# Patient Record
Sex: Female | Born: 1990 | Race: White | Hispanic: No | Marital: Single | Smoking: Never smoker
Health system: Southern US, Community
[De-identification: ages and names within clinical notes are randomized; demographics above are authoritative.]

## PROBLEM LIST (undated history)

## (undated) DIAGNOSIS — N200 Calculus of kidney: Secondary | ICD-10-CM

## (undated) DIAGNOSIS — B084 Enteroviral vesicular stomatitis with exanthem: Secondary | ICD-10-CM

## (undated) HISTORY — PX: BREAST SURGERY: SHX581

---

## 2018-03-10 ENCOUNTER — Encounter (HOSPITAL_COMMUNITY): Payer: Self-pay

## 2018-03-10 ENCOUNTER — Emergency Department (HOSPITAL_COMMUNITY)
Admission: EM | Admit: 2018-03-10 | Discharge: 2018-03-10 | Disposition: A | Discharge status: Home / Self Care | Payer: BLUE CROSS/BLUE SHIELD | Attending: Emergency Medicine | Admitting: Emergency Medicine

## 2018-03-10 DIAGNOSIS — R350 Frequency of micturition: Secondary | ICD-10-CM | POA: Insufficient documentation

## 2018-03-10 DIAGNOSIS — N3001 Acute cystitis with hematuria: Secondary | ICD-10-CM | POA: Diagnosis not present

## 2018-03-10 DIAGNOSIS — R300 Dysuria: Secondary | ICD-10-CM | POA: Diagnosis not present

## 2018-03-10 DIAGNOSIS — R1030 Lower abdominal pain, unspecified: Secondary | ICD-10-CM | POA: Diagnosis present

## 2018-03-10 HISTORY — DX: Enteroviral vesicular stomatitis with exanthem: B08.4

## 2018-03-10 HISTORY — DX: Calculus of kidney: N20.0

## 2018-03-10 LAB — URINALYSIS, ROUTINE W REFLEX MICROSCOPIC
BILIRUBIN URINE: NEGATIVE
GLUCOSE, UA: NEGATIVE mg/dL
Ketones, ur: 5 mg/dL — AB
Nitrite: POSITIVE — AB
Protein, ur: 100 mg/dL — AB
RBC / HPF: >50 RBC/hpf — ABNORMAL HIGH (ref 0–5)
SPECIFIC GRAVITY, URINE: 1.029 (ref 1.005–1.030)
pH: 6 (ref 5.0–8.0)

## 2018-03-10 LAB — CBC
HEMATOCRIT: 39 % (ref 36.0–46.0)
Hemoglobin: 12.7 g/dL (ref 12.0–15.0)
MCH: 29.5 pg (ref 26.0–34.0)
MCHC: 32.6 g/dL (ref 30.0–36.0)
MCV: 90.5 fL (ref 80.0–100.0)
Platelets: 336 10*3/uL (ref 150–400)
RBC: 4.31 MIL/uL (ref 3.87–5.11)
RDW: 12.1 % (ref 11.5–15.5)
WBC: 10.6 10*3/uL — ABNORMAL HIGH (ref 4.0–10.5)
nRBC: 0 % (ref 0.0–0.2)

## 2018-03-10 LAB — COMPREHENSIVE METABOLIC PANEL
ALT: 20 U/L (ref 0–44)
AST: 27 U/L (ref 15–41)
Albumin: 4.1 g/dL (ref 3.5–5.0)
Alkaline Phosphatase: 47 U/L (ref 38–126)
Anion gap: 8 (ref 5–15)
BUN: 19 mg/dL (ref 6–20)
CHLORIDE: 106 mmol/L (ref 98–111)
CO2: 25 mmol/L (ref 22–32)
CREATININE: 0.95 mg/dL (ref 0.44–1.00)
Calcium: 9.6 mg/dL (ref 8.9–10.3)
GFR calc Af Amer: >60 mL/min (ref >60)
GLUCOSE: 92 mg/dL (ref 70–99)
Potassium: 4.3 mmol/L (ref 3.5–5.1)
SODIUM: 139 mmol/L (ref 135–145)
Total Bilirubin: 0.6 mg/dL (ref 0.3–1.2)
Total Protein: 6.3 g/dL — ABNORMAL LOW (ref 6.5–8.1)

## 2018-03-10 LAB — LIPASE, BLOOD: LIPASE: 47 U/L (ref 11–51)

## 2018-03-10 LAB — I-STAT BETA HCG BLOOD, ED (MC, WL, AP ONLY)

## 2018-03-10 MED ORDER — PHENAZOPYRIDINE HCL 200 MG PO TABS: 200.0000 mg | ORAL_TABLET | Freq: Three times a day (TID) | ORAL | 0 refills | Status: DC | PRN

## 2018-03-10 MED ORDER — CEPHALEXIN 500 MG PO CAPS: 500.0000 mg | ORAL_CAPSULE | Freq: Four times a day (QID) | ORAL | 0 refills | Status: DC

## 2018-03-10 MED ORDER — CEPHALEXIN 250 MG PO CAPS
500.0000 mg | ORAL_CAPSULE | Freq: Once | ORAL | Status: AC
  Administered 2018-03-10: 500 mg via ORAL
  Filled 2018-03-10: qty 2

## 2018-03-10 NOTE — ED Notes (Signed)
Patient verbalizes understanding of discharge instructions. Opportunity for questioning and answers were provided. Armband removed by staff, pt discharged from ED ambulatory w/ SO 

## 2018-03-10 NOTE — ED Triage Notes (Signed)
Pt reports that she began to have dysuria this AM, took AZO without relief, pain became worse, no having lower abd pain and hematuria.

## 2018-03-10 NOTE — Discharge Instructions (Addendum)
Keflex and Pyridium as prescribed.  Follow-up with primary doctor if symptoms are not improving in the next 2 to 3 days, and return to the ER if you develop worsening pain, high fevers, or other new and concerning symptoms.

## 2018-03-10 NOTE — ED Provider Notes (Signed)
[facility] EMERGENCY DEPARTMENT Provider Note   CSN: [id number] Arrival date & time: 03/10/18  0053     History   Chief Complaint Chief Complaint  Patient presents with  . Abdominal Pain    HPI Stacy Medina is a 27 y.o. female.  Patient is a 27 year old female with complaints of urinary frequency, dysuria, and suprapubic discomfort that has been present for the past 2 days.  She attempted over-the-counter Azo with little relief.  She denies any fevers or chills.  She denies any bowel complaints.  Her last menstrual period was 3 weeks ago and normal and she denies the possibility of pregnancy.  There is no vaginal discharge or abnormal bleeding.  The history is provided by the patient.  Dysuria   This is a new problem. The current episode started 2 days ago. The problem occurs every urination. The problem has been gradually worsening. The quality of the pain is described as burning. The pain is moderate. There has been no fever. Associated symptoms include hematuria and urgency. Pertinent negatives include no chills. Treatments tried: azo.    Past Medical History:  Diagnosis Date  . Hand, foot and mouth disease   . Kidney stones     There are no active problems to display for this patient.   Past Surgical History:  Procedure Laterality Date  . BREAST SURGERY       OB History   None      Home Medications    Prior to Admission medications   Not on File    Family History No family history on file.  Social History Social History   Tobacco Use  . Smoking status: Never Smoker  . Smokeless tobacco: Never Used  Substance Use Topics  . Alcohol use: Never    Frequency: Never  . Drug use: Never     Allergies   Bactrim [sulfamethoxazole-trimethoprim]   Review of Systems Review of Systems  Constitutional: Negative for chills.  Genitourinary: Positive for dysuria, hematuria and urgency.  All other systems reviewed and are  negative.    Physical Exam Updated Vital Signs BP 123/77 (BP Location: Right Arm)   Pulse (!) 58   Temp (!) 97.4 F (36.3 C) (Oral)   Resp 16   LMP 02/14/2018   SpO2 100%   Physical Exam  Constitutional: She is oriented to person, place, and time. She appears well-developed and well-nourished. No distress.  HENT:  Head: Normocephalic and atraumatic.  Neck: Normal range of motion. Neck supple.  Cardiovascular: Normal rate and regular rhythm. Exam reveals no gallop and no friction rub.  No murmur heard. Pulmonary/Chest: Effort normal and breath sounds normal. No respiratory distress. She has no wheezes.  Abdominal: Soft. Bowel sounds are normal. She exhibits no distension. There is tenderness in the suprapubic area. There is no rigidity, no rebound and no guarding.  Musculoskeletal: Normal range of motion.  Neurological: She is alert and oriented to person, place, and time.  Skin: Skin is warm and dry. She is not diaphoretic.  Nursing note and vitals reviewed.    ED Treatments / Results  Labs (all labs ordered are listed, but only abnormal results are displayed) Labs Reviewed  COMPREHENSIVE METABOLIC PANEL - Abnormal; Notable for the following components:      Result Value   Total Protein 6.3 (*)    All other components within normal limits  CBC - Abnormal; Notable for the following components:   WBC 10.6 (*)    All other  components within normal limits  URINALYSIS, ROUTINE W REFLEX MICROSCOPIC - Abnormal; Notable for the following components:   Color, Urine AMBER (*)    APPearance CLOUDY (*)    Hgb urine dipstick LARGE (*)    Ketones, ur 5 (*)    Protein, ur 100 (*)    Nitrite POSITIVE (*)    Leukocytes, UA SMALL (*)    RBC / HPF >50 (*)    Bacteria, UA RARE (*)    Non Squamous Epithelial 0-5 (*)    All other components within normal limits  LIPASE, BLOOD  I-STAT BETA HCG BLOOD, ED ([facility], [facility], [facility] ONLY)    EKG None  Radiology No results  found.  Procedures Procedures (including critical care time)  Medications Ordered in ED Medications  cephALEXin (KEFLEX) capsule 500 mg (has no administration in time range)     Initial Impression / Assessment and Plan / ED Course  I have reviewed the triage vital signs and the nursing notes.  Pertinent labs & imaging results that were available during my care of the patient were reviewed by me and considered in my medical decision making (see chart for details).  Symptoms, exam, and urinalysis most consistent with a UTI.  This will be treated with Keflex and Pyridium.  She is to return as needed for any problems.  Final Clinical Impressions(s) / ED Diagnoses   Final diagnoses:  None    ED Discharge Orders    None       Geoffery Lyons, MD 03/10/18 [phone]

## 2019-04-06 ENCOUNTER — Encounter: Payer: BLUE CROSS/BLUE SHIELD | Admitting: Certified Nurse Midwife

## 2019-04-28 ENCOUNTER — Ambulatory Visit: Payer: BC Managed Care – PPO | Admitting: Pediatrics

## 2019-04-28 ENCOUNTER — Other Ambulatory Visit: Payer: Self-pay

## 2019-04-28 VITALS — BP 120/72 | Ht 66.0 in | Wt 130.0 lb

## 2019-04-28 DIAGNOSIS — S76112A Strain of left quadriceps muscle, fascia and tendon, initial encounter: Secondary | ICD-10-CM | POA: Diagnosis not present

## 2019-04-28 NOTE — Progress Notes (Signed)
  Stacy Medina - 28 y.o. female MRN [id number]  Date of birth: April 21, 1991  SUBJECTIVE:   CC:  Left quad pain  28 yo female presenting with left quad pain for the past week. She reports that she woke up with pain in mid quad, was limping slightly due to pain. She does not recall an injury or inciting event but she does do HIT workouts often that involves jump squats, etc. She runs 4-5 days a week, 3-9 miles at a time. Earlier in the month, she had a hamstring injury on her right and she wonders if she was overcompensating on her left leg while running. She has tried running on the leg and the pain improves during her runs but does not go away. Pain is 5-6 out of 10. Pain feels like a knot and also very tight. Pain with standing on single leg and she finds herself limping.  ROS: No swelling, instability, numbness/tingling, redness, otherwise see HPI   PMHx - Updated and reviewed.  Contributory factors include: Negative PSHx - Updated and reviewed.  Contributory factors include:  Negative FHx - Updated and reviewed.  Contributory factors include:  Negative Social Hx - Updated and reviewed. Contributory factors include: Negative Medications - reviewed   DATA REVIEWED: none  PHYSICAL EXAM:  VS: BP:120/72  HR: bpm  TEMP: ( )  RESP:   HT:5\' 6"  (167.6 cm)   WT:130 lb (59 kg)  BMI:20.99 PHYSICAL EXAM: Gen: NAD, alert, cooperative with exam, well-appearing HEENT: clear conjunctiva,  CV:  no edema, capillary refill brisk, normal rate Resp: non-labored Skin: no rashes, normal turgor  Neuro: no gross deficits.  Psych:  alert and oriented  Left leg: Inspection: normal appearing leg, strong and defined quads Palpation: TTP at mid vastus lateralis with palpable knot with tenderness ROM: Able to fully flex and extend knee without pain Strength: normal NVI Special testing: Fulcrum test negative No pain with hip IR, ER. FADIR, FABER negative.   Right leg: Inspection: normal appearing leg,  strong and defined quads Palpation: no TTP ROM: full ROM Strength: normal NVI  [place] of left vastus lateralis, vastus medialis, rectus femoris with normal appearing muscle fibers, no hypoechoic changes or disorganization noted. No neovascularization noted.  Impression: normal appearing quadriceps muscles  ASSESSMENT & PLAN:   28 yo female presenting with acute left leg pain with palpable myofascial trigger point. Will treat for quadricep strain although [place] was normal in appearance. Recommended body helix sleeve and rest from activity that causes pain. Return in 3 weeks.  I was the preceptor for this visit and available for immediate consultation Shellia Cleverly, DO

## 2019-05-09 ENCOUNTER — Ambulatory Visit
Admission: RE | Admit: 2019-05-09 | Discharge: 2019-05-09 | Disposition: A | Discharge status: Home / Self Care | Payer: BC Managed Care – PPO | Source: Ambulatory Visit | Attending: Family Medicine | Admitting: Family Medicine

## 2019-05-09 ENCOUNTER — Other Ambulatory Visit: Payer: Self-pay

## 2019-05-09 ENCOUNTER — Ambulatory Visit: Payer: BC Managed Care – PPO | Admitting: Family Medicine

## 2019-05-09 VITALS — BP 104/70 | Ht 66.0 in | Wt 130.0 lb

## 2019-05-09 DIAGNOSIS — M79606 Pain in leg, unspecified: Secondary | ICD-10-CM

## 2019-05-09 DIAGNOSIS — S76112A Strain of left quadriceps muscle, fascia and tendon, initial encounter: Secondary | ICD-10-CM

## 2019-05-09 NOTE — Patient Instructions (Signed)
Get the x-rays of your femur after you leave here. If these are normal I would go ahead with an MRI of your femur to assess for a stress fracture. Icing, tylenol, aleve if needed. Avoid jumping, running, HIIT training for now - cycling, swimming, walking, elliptical are ok if not painful for now.

## 2019-05-10 ENCOUNTER — Encounter: Payer: Self-pay | Admitting: Family Medicine

## 2019-05-10 NOTE — Progress Notes (Signed)
PCP: Pieter Partridge, PA  Subjective:   HPI: Patient is a 28 y.o. female here for left thigh pain.  Patient called today noting worsening of pain since last visit in left quad/thigh. She had some issues with right hamstring prior to this and thinks it's possible she's been putting more weight onto left side with exercise. She was running about 20-40 miles a week prior to start of pain and also doing HIIT workouts. She notes pain seems to improve with running, worse first thing in the morning and has had difficulty bearing weight at times but moreso 2-3 weeks ago. No history of stress fracture. Pain with standing on single leg. No bruising, new injuries.  Past Medical History:  Diagnosis Date  . Hand, foot and mouth disease   . Kidney stones     No current outpatient medications on file prior to visit.   No current facility-administered medications on file prior to visit.    Past Surgical History:  Procedure Laterality Date  . BREAST SURGERY      Allergies  Allergen Reactions  . Bactrim [Sulfamethoxazole-Trimethoprim] Rash    Social History   Socioeconomic History  . Marital status: Single    Spouse name: Not on file  . Number of children: Not on file  . Years of education: Not on file  . Highest education level: Not on file  Occupational History  . Not on file  Tobacco Use  . Smoking status: Never Smoker  . Smokeless tobacco: Never Used  Substance and Sexual Activity  . Alcohol use: Never  . Drug use: Never  . Sexual activity: Not on file  Other Topics Concern  . Not on file  Social History Narrative  . Not on file   Social Determinants of Health   Financial Resource Strain:   . Difficulty of Paying Living Expenses: Not on file  Food Insecurity:   . Worried About Charity fundraiser in the Last Year: Not on file  . Ran Out of Food in the Last Year: Not on file  Transportation Needs:   . Lack of Transportation (Medical): Not on file  . Lack of  Transportation (Non-Medical): Not on file  Physical Activity:   . Days of Exercise per Week: Not on file  . Minutes of Exercise per Session: Not on file  Stress:   . Feeling of Stress : Not on file  Social Connections:   . Frequency of Communication with Friends and Family: Not on file  . Frequency of Social Gatherings with Friends and Family: Not on file  . Attends Religious Services: Not on file  . Active Member of Clubs or Organizations: Not on file  . Attends Archivist Meetings: Not on file  . Marital Status: Not on file  Intimate Partner Violence:   . Fear of Current or Ex-Partner: Not on file  . Emotionally Abused: Not on file  . Physically Abused: Not on file  . Sexually Abused: Not on file    History reviewed. No pertinent family history.  BP 104/70   Ht 5\' 6"  (1.676 m)   Wt 130 lb (59 kg)   LMP 05/06/2019 (Within Days)   BMI 20.98 kg/m   Review of Systems: See HPI above.     Objective:  Physical Exam:  Gen: NAD, comfortable in exam room  Left hip/thigh: No deformity. FROM with 5/5 strength all motions and feels some discomfort with hip external rotation. No tenderness to palpation (reports 'deeper' to palpation  of proximal thigh anteriorly over femur). NVI distally. Negative logroll. Negative fulcrum. Discomfort with hop test.   Assessment & Plan:  1. Left thigh pain - Patient's volume of running, location and depth of pain, severity, discomfort with hop test, normal ultrasound of musculature concerning for possible proximal femur stress fracture despite feeling some improvement with running.  Independently reviewed radiographs and no fracture noted but on femur radiographs there is increased mineralization proximally medial femur - however with how advanced this callus would be coupled with only 3 weeks of symptoms I suspect this is remote and unrelated to her current pain.  Advised against running, jumping, HIIT for now.  Will obtain MRI to further  assess.  Tylenol, aleve if needed.

## 2019-05-10 NOTE — Addendum Note (Signed)
Addended by: Cyd Silence on: 05/10/2019 10:19 AM   Modules accepted: Orders

## 2019-05-19 ENCOUNTER — Ambulatory Visit: Payer: BC Managed Care – PPO | Admitting: Pediatrics

## 2019-05-21 ENCOUNTER — Ambulatory Visit
Admission: RE | Admit: 2019-05-21 | Discharge: 2019-05-21 | Disposition: A | Discharge status: Home / Self Care | Payer: BC Managed Care – PPO | Source: Ambulatory Visit | Attending: Family Medicine | Admitting: Family Medicine

## 2019-05-21 ENCOUNTER — Other Ambulatory Visit: Payer: Self-pay

## 2019-05-21 DIAGNOSIS — M79606 Pain in leg, unspecified: Secondary | ICD-10-CM

## 2019-05-23 ENCOUNTER — Telehealth (INDEPENDENT_AMBULATORY_CARE_PROVIDER_SITE_OTHER): Payer: BC Managed Care – PPO | Admitting: Family Medicine

## 2019-05-23 VITALS — Ht 65.0 in | Wt 135.0 lb

## 2019-05-23 DIAGNOSIS — M79606 Pain in leg, unspecified: Secondary | ICD-10-CM

## 2019-05-23 NOTE — Progress Notes (Signed)
MRI reviewed and discussed with patient via video visit.  She has a couple focal areas of stress reaction of her femoral shaft.  She's not run in over 2 weeks.  Has no pain with cross training, elliptical.  She does have some mild pain briefly when she wakes up in the morning at about a 1-2/10 level at most.  We discussed treatment for this.  Given it's not a true stress fracture and her symptoms are much better than would expect, do not think a 12 week protocol is necessary at this point.  Advised to wait about 2 weeks (sooner if she has no pain in the morning) before starting walk:jog 1:1 for 10 minutes (increase jog interval by 1 minute, total jog time by 5 minutes) every other day.  Ok for weight training at that time as well.  No box jumps, plyo activities.  Plan to f/u in office in 3 weeks for reevaluation.

## 2019-07-01 ENCOUNTER — Ambulatory Visit: Payer: BC Managed Care – PPO

## 2019-07-01 ENCOUNTER — Ambulatory Visit: Payer: BC Managed Care – PPO | Attending: Internal Medicine

## 2019-07-01 DIAGNOSIS — Z23 Encounter for immunization: Secondary | ICD-10-CM | POA: Insufficient documentation

## 2019-07-01 NOTE — Progress Notes (Signed)
   Covid-19 Vaccination Clinic  Name:  Stacy Medina    MRN: [id number] DOB: 05-08-1991  07/01/2019  Ms. Stacy Medina was observed post Covid-19 immunization for 15 minutes without incidence. She was provided with Vaccine Information Sheet and instruction to access the V-Safe system.   Ms. Stacy Medina was instructed to call 911 with any severe reactions post vaccine: Marland Kitchen Difficulty breathing  . Swelling of your face and throat  . A fast heartbeat  . A bad rash all over your body  . Dizziness and weakness    Immunizations Administered    Name Date Dose VIS Date Route   Pfizer COVID-19 Vaccine 07/01/2019  1:16 PM 0.3 mL 04/22/2019 Intramuscular   Manufacturer: [employer], [employer]   Lot: [id number]   NDC: [address]-0

## 2019-07-25 ENCOUNTER — Encounter: Payer: Self-pay | Admitting: Certified Nurse Midwife

## 2019-07-26 ENCOUNTER — Ambulatory Visit: Payer: BC Managed Care – PPO

## 2019-07-26 ENCOUNTER — Ambulatory Visit: Payer: BC Managed Care – PPO | Attending: Internal Medicine

## 2019-07-26 DIAGNOSIS — Z23 Encounter for immunization: Secondary | ICD-10-CM

## 2019-07-26 NOTE — Progress Notes (Signed)
   Covid-19 Vaccination Clinic  Name:  Tinleigh Whitmire    MRN: [id number] DOB: [birth year]  07/26/2019  Ms. Kutter was observed post Covid-19 immunization for 15 minutes without incident. She was provided with Vaccine Information Sheet and instruction to access the V-Safe system.   Ms. Minervini was instructed to call 911 with any severe reactions post vaccine: Marland Kitchen Difficulty breathing  . Swelling of face and throat  . A fast heartbeat  . A bad rash all over body  . Dizziness and weakness   Immunizations Administered    Name Date Dose VIS Date Route   Pfizer COVID-19 Vaccine 07/26/2019  2:51 PM 0.3 mL 04/22/2019 Intramuscular   Manufacturer: [employer], [employer]   Lot: [id number]   NDC: [address]-4

## 2019-07-27 ENCOUNTER — Encounter: Payer: Self-pay | Admitting: Certified Nurse Midwife

## 2019-11-02 ENCOUNTER — Ambulatory Visit (INDEPENDENT_AMBULATORY_CARE_PROVIDER_SITE_OTHER): Payer: BC Managed Care – PPO | Admitting: Sports Medicine

## 2019-11-02 ENCOUNTER — Encounter: Payer: Self-pay | Admitting: Sports Medicine

## 2019-11-02 ENCOUNTER — Other Ambulatory Visit: Payer: Self-pay

## 2019-11-02 VITALS — BP 120/72 | Ht 65.0 in | Wt 130.0 lb

## 2019-11-02 DIAGNOSIS — M7632 Iliotibial band syndrome, left leg: Secondary | ICD-10-CM | POA: Diagnosis not present

## 2019-11-02 NOTE — Patient Instructions (Signed)
The area of your pain is consistent with IT band syndrome.  Given this we will treat you for IT band syndrome to see if this improves your symptoms. -Work on the stretching and strengthening exercises shown to you at today's visit -You may take Aleve twice a day for the next 1 to 2 weeks to see if this improves some of your pain -I would recommend cutting back on the volume of running by about 30 to 40%.  You may cross train by biking, swimming, or elliptical.  I would like to see you back in 1 month if you are still having symptoms and we can modify the plan accordingly

## 2019-11-02 NOTE — Progress Notes (Addendum)
PCP: Roderick Pee, PA  Subjective:   HPI: Patient is a 29 y.o. female here for evaluation of left IT band pain.  Pain started approximately 1 month ago.  Patient ran a half marathon around the time where her pain started hurting.  The pain is located on the lateral aspect of her leg by her knee.  The pain will occasionally radiate up her leg towards her hip.  Occasionally she will get lateral hip pain.  She denies any specific injury or trauma.  The pain is not worsened by running downhill.  The pain is mostly present during the first 3 to 5 minutes of the run and then will improve when she gets warmed up.  Patient does have a history of femoral neck stress reaction back in January that was seen on an MRI.  This improved with rest.  Patient notes the pain she is having now is very different than what she was experiencing when she had the femoral shaft stress reaction.  She has been taking ibuprofen intermittently as needed for pain which is mildly improved her symptoms.   Review of Systems: See HPI above.  Past Medical History:  Diagnosis Date  . Hand, foot and mouth disease   . Kidney stones     No current outpatient medications on file prior to visit.   No current facility-administered medications on file prior to visit.    Past Surgical History:  Procedure Laterality Date  . BREAST SURGERY      Allergies  Allergen Reactions  . Bactrim [Sulfamethoxazole-Trimethoprim] Rash    Social History   Socioeconomic History  . Marital status: Single    Spouse name: Not on file  . Number of children: Not on file  . Years of education: Not on file  . Highest education level: Not on file  Occupational History  . Not on file  Tobacco Use  . Smoking status: Never Smoker  . Smokeless tobacco: Never Used  Substance and Sexual Activity  . Alcohol use: Never  . Drug use: Never  . Sexual activity: Not on file  Other Topics Concern  . Not on file  Social History Narrative  . Not on  file   Social Determinants of Health   Financial Resource Strain:   . Difficulty of Paying Living Expenses:   Food Insecurity:   . Worried About Programme researcher, broadcasting/film/video in the Last Year:   . Barista in the Last Year:   Transportation Needs:   . Freight forwarder (Medical):   Marland Kitchen Lack of Transportation (Non-Medical):   Physical Activity:   . Days of Exercise per Week:   . Minutes of Exercise per Session:   Stress:   . Feeling of Stress :   Social Connections:   . Frequency of Communication with Friends and Family:   . Frequency of Social Gatherings with Friends and Family:   . Attends Religious Services:   . Active Member of Clubs or Organizations:   . Attends Banker Meetings:   Marland Kitchen Marital Status:   Intimate Partner Violence:   . Fear of Current or Ex-Partner:   . Emotionally Abused:   Marland Kitchen Physically Abused:   . Sexually Abused:     History reviewed. No pertinent family history.      Objective:  Physical Exam: BP 120/72   Ht 5\' 5"  (1.651 m)   Wt 130 lb (59 kg)   BMI 21.63 kg/m  Gen: NAD, comfortable in exam  room Lungs: Breathing comfortably on room air Knee Exam Left -Inspection: no deformity, no discoloration -Palpation: No tenderness palpation at the IT band.  No tenderness palpation at the hamstrings. -ROM: Extension: -10 degrees; Flexion: 150 degrees -Strength: Extension: 5/5; Flexion: 5/5 -Special Tests: Varus Stress: Negative; Valgus Stress: Negative; Obers: Negative -Limb neurovascularly intact, no instability noted  Hip Exam Left -Inspection: No deformity, no discoloration -Palpation: No tenderness palpation -ROM: Normal ROM with flexion, extension, abduction, internal rotation, external rotation -Strength: Flexion: 5/5; Extension 5/5; Abduction: 5/5 -Special Tests: Obers: Negative -Limb neurovascularly intact  Limited diagnostic ultrasound of the left IT band Findings: -Small amount of fluid was seen at the IT band insertion.   The IT band itself was intact without any evidence of tearing or fraying.  There were no hypoechoic changes within the IT band itself Impression: -Ultrasound showing fluid collection IT band insertion consistent with IT band syndrome     Assessment & Plan:  Patient is a 29 y.o. female here for left IT band pain  1.  Left IT band syndrome -Location of patient's pain is consistent with IT band syndrome however her physical exam was normal also unable to reproduce her symptoms -Ultrasound of the IT band showed that the IT band itself was intact without hypoechoic changes.  There was a small amount of fluid collection at the IT band insertion. -We will start treatment by treating her for IT band syndrome.  This will include hip abduction strengthening, IT band stretches, taking Aleve twice a day for the next 1 to 2 weeks. -I have also advised that patient cut back her running volume by 3040%.  She may cross train by biking, swimming, or elliptical  Patient will follow up in 4 weeks if she is not having improvement in her pain  I was the preceptor for this visit and available for immediate consultation Shellia Cleverly, DO

## 2021-02-21 IMAGING — MR MR FEMUR*L* W/O CM
4 of 5 series · 11 of 40 positions shown · non-contrast
Comparison: Plain films left hip and femur 05/09/2019.

CLINICAL DATA: Proximal left thigh pain for 1 month which began
after beginning a high intensity workout program. Pain has begun to
improve over the past few weeks.

EXAM:
MR OF THE LEFT FEMUR WITHOUT CONTRAST
TECHNIQUE: Multiplanar, multisequence MR imaging of the left femur. Was
performed. No intravenous contrast was administered.

[Series 5: T1 · coronal · 4.0mm · 0.49mm/px · 3 of 28 slices shown (1 of 2)]
[im 6/28]
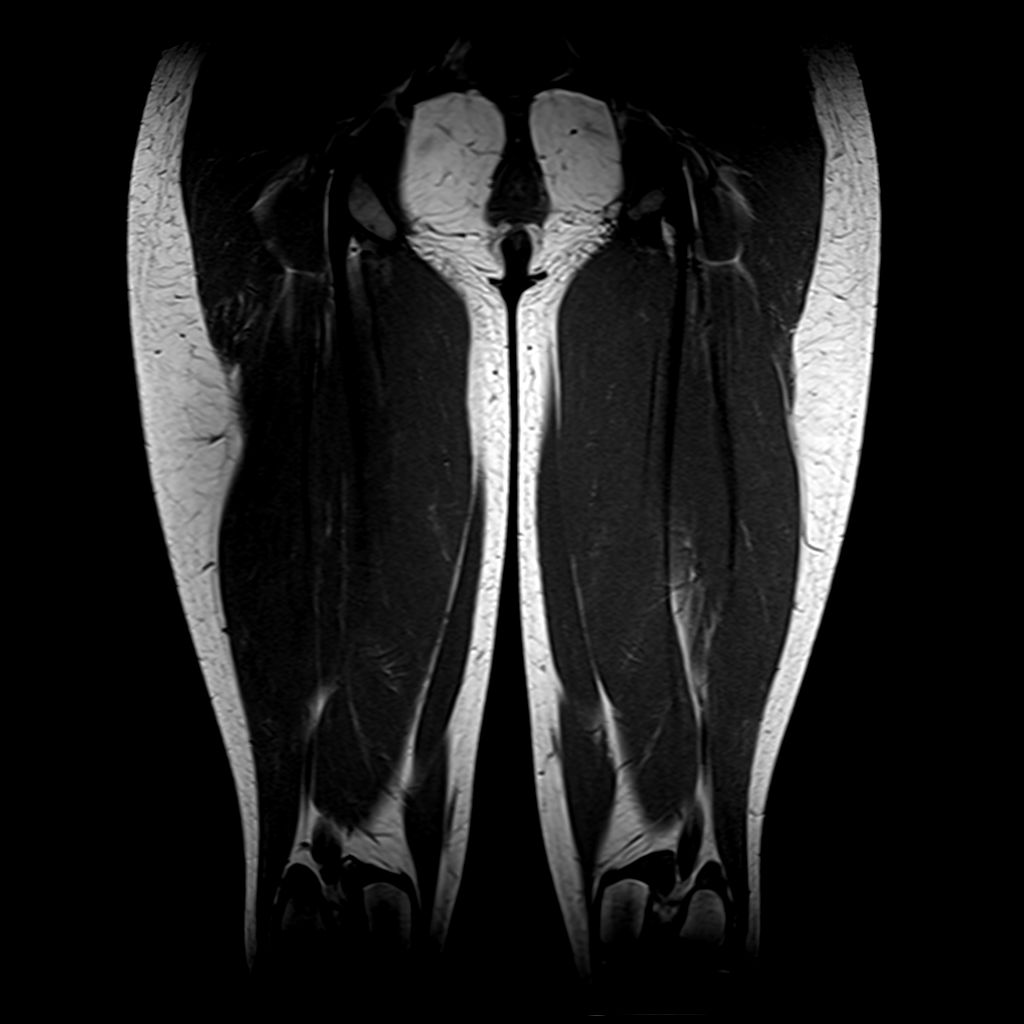
[im 17/28]
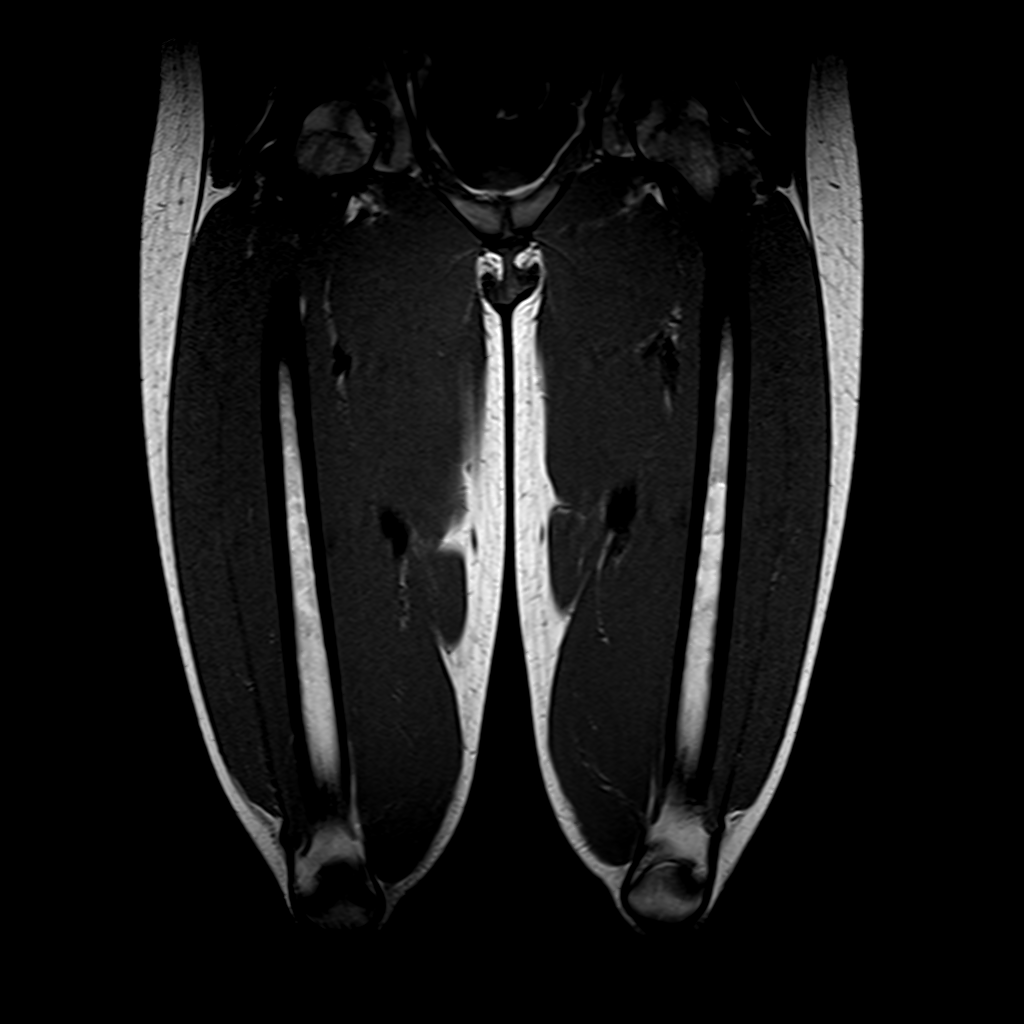
[im 28/28]
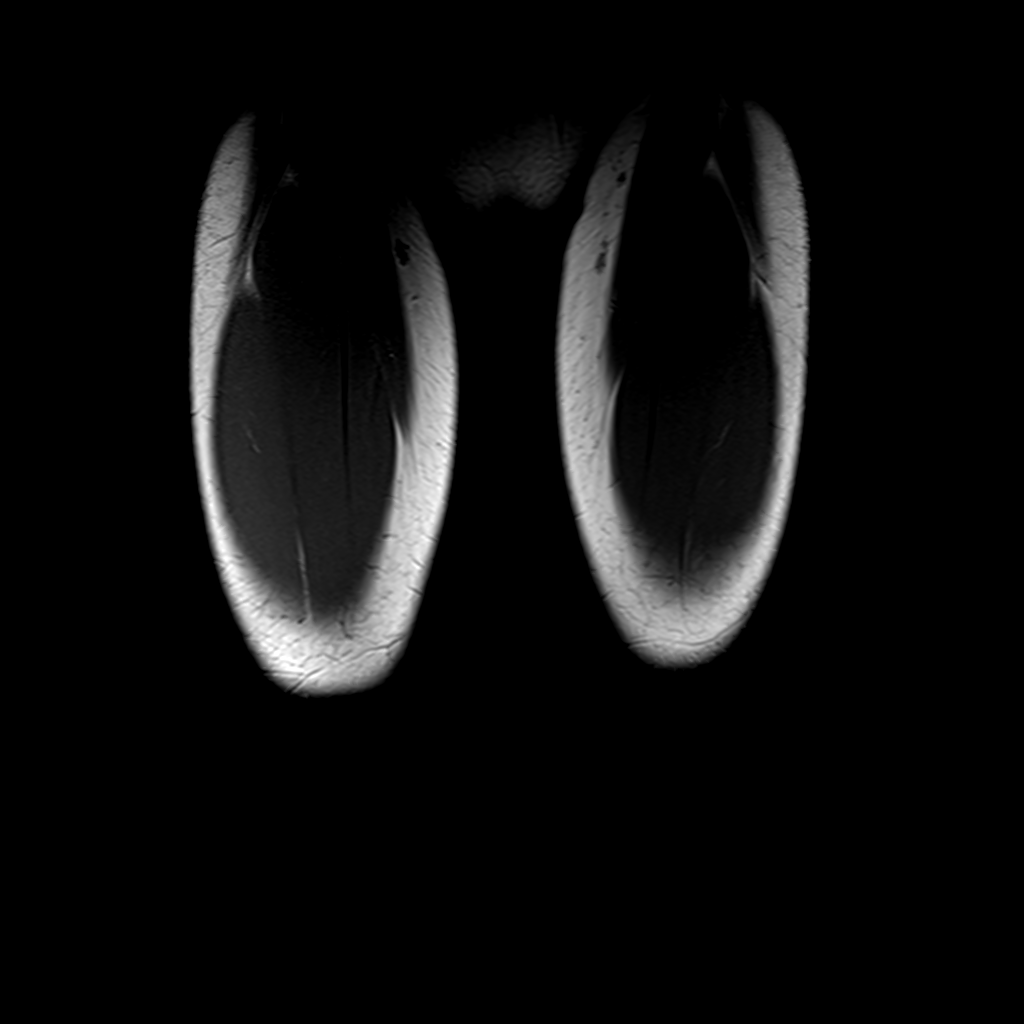

[Series 8: T1 · axial · 5.0mm · 0.38mm/px · z∈[-97,+45]mm · 2 of 60 slices shown (2 of 2)]
[im 6/60]
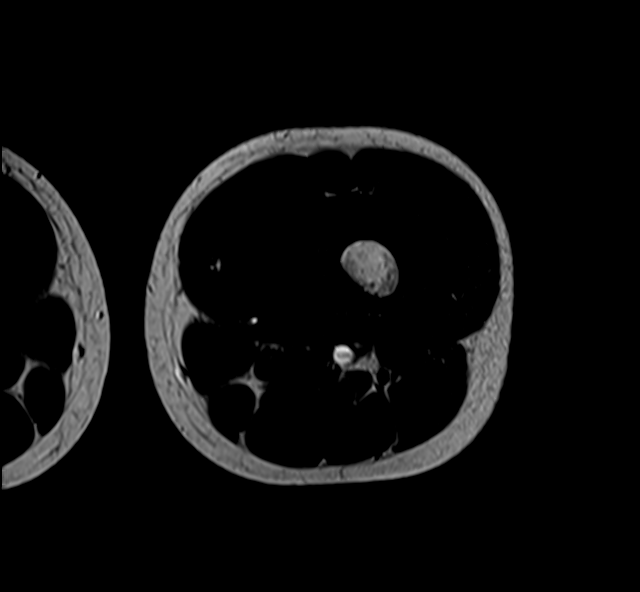
[im 30/60]
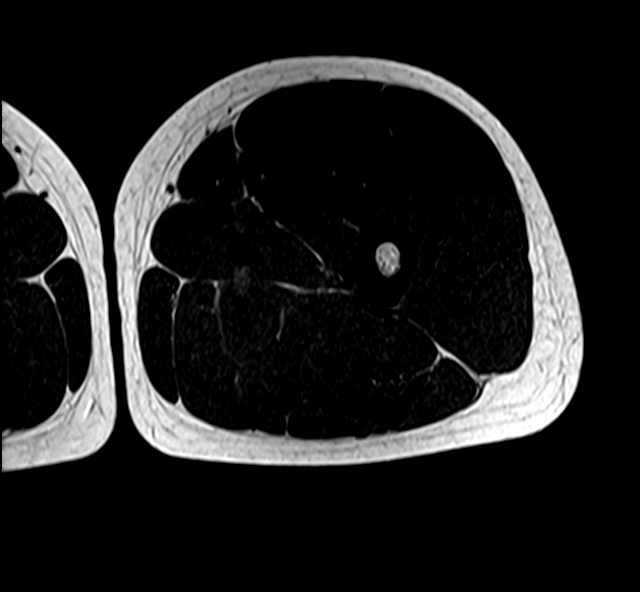

[Series 9: T2 fat-sat · axial · 5.0mm · 0.38mm/px · z∈[-95,+189]mm · 3 of 60 slices shown (1 of 2)]
[im 6/60]
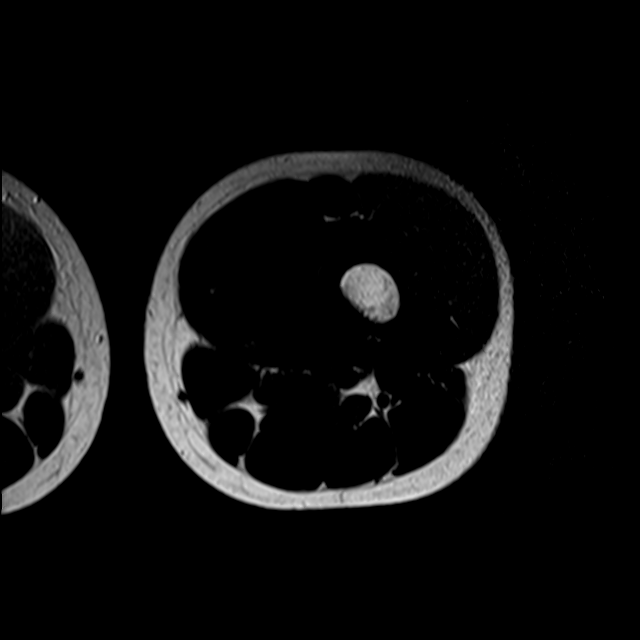
[im 30/60]
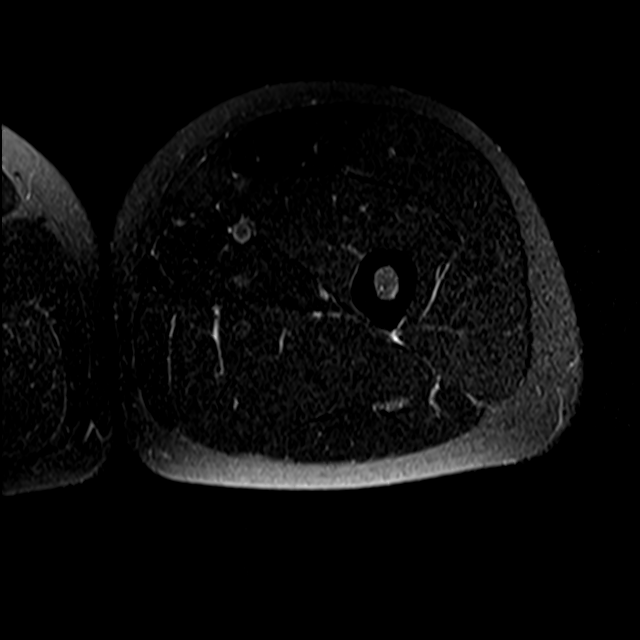
[im 54/60]
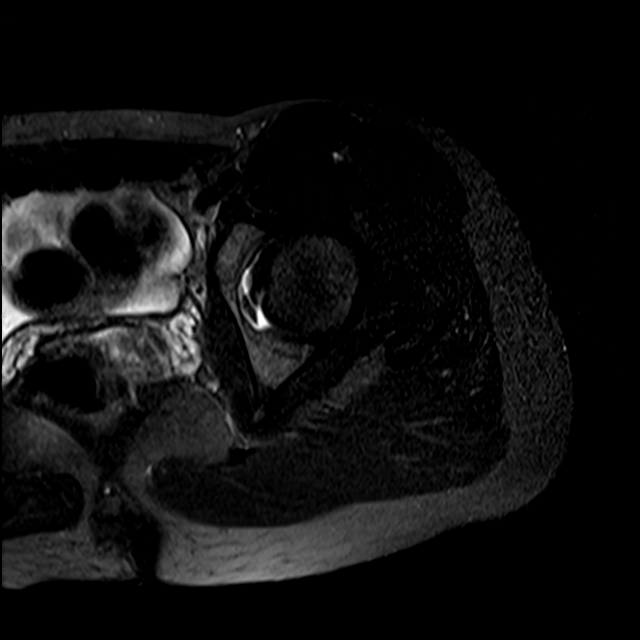

[Series 10: T2 fat-sat · sagittal · 3.5mm · 1.12mm/px · 3 of 40 slices shown (2 of 2)]
[im 7/40]
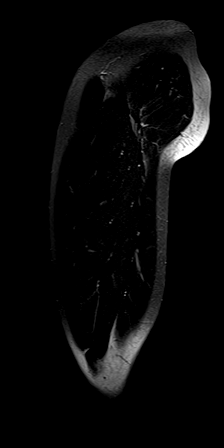
[im 20/40]
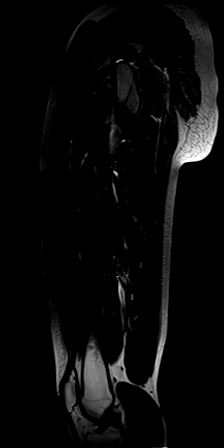
[im 33/40]
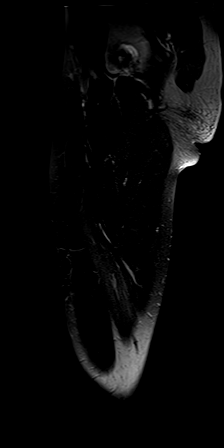

[11 of 40 positions shown; findings below may reference images not displayed]

FINDINGS: Bones/Joint/Cartilage

There is a small amount of fluid along the cortex of the proximal
diaphysis of the left femur with mild marrow edema deep to the
medial cortex consistent with stress change. A tiny focus of
subcortical edema is seen in the medullary space of the mid
diaphysis of the femur. No fracture is identified. Marrow signal is
otherwise normal.

Ligaments

Normal.

Muscles and Tendons

Normal.

Soft tissues

Normal.
IMPRESSION: Findings consistent with 2 foci stress change in the proximal and
mid diaphysis of the left femur. Negative for fracture.
